# Patient Record
Sex: Female | Born: 1945 | Race: Black or African American | Hispanic: No | Smoking: Former smoker
Health system: Southern US, Community
[De-identification: ages and names within clinical notes are randomized; demographics above are authoritative.]

## PROBLEM LIST (undated history)

## (undated) DIAGNOSIS — H409 Unspecified glaucoma: Secondary | ICD-10-CM

## (undated) DIAGNOSIS — D869 Sarcoidosis, unspecified: Secondary | ICD-10-CM

## (undated) DIAGNOSIS — G2581 Restless legs syndrome: Secondary | ICD-10-CM

## (undated) DIAGNOSIS — H11153 Pinguecula, bilateral: Secondary | ICD-10-CM

## (undated) DIAGNOSIS — Z91018 Allergy to other foods: Secondary | ICD-10-CM

## (undated) DIAGNOSIS — M1990 Unspecified osteoarthritis, unspecified site: Secondary | ICD-10-CM

## (undated) DIAGNOSIS — Z780 Asymptomatic menopausal state: Secondary | ICD-10-CM

## (undated) DIAGNOSIS — M4302 Spondylolysis, cervical region: Secondary | ICD-10-CM

## (undated) DIAGNOSIS — I82409 Acute embolism and thrombosis of unspecified deep veins of unspecified lower extremity: Secondary | ICD-10-CM

## (undated) DIAGNOSIS — J309 Allergic rhinitis, unspecified: Secondary | ICD-10-CM

## (undated) DIAGNOSIS — J449 Chronic obstructive pulmonary disease, unspecified: Secondary | ICD-10-CM

## (undated) DIAGNOSIS — K5900 Constipation, unspecified: Secondary | ICD-10-CM

## (undated) DIAGNOSIS — R42 Dizziness and giddiness: Secondary | ICD-10-CM

## (undated) DIAGNOSIS — I10 Essential (primary) hypertension: Secondary | ICD-10-CM

## (undated) DIAGNOSIS — H35361 Drusen (degenerative) of macula, right eye: Secondary | ICD-10-CM

## (undated) DIAGNOSIS — J439 Emphysema, unspecified: Secondary | ICD-10-CM

## (undated) DIAGNOSIS — J45909 Unspecified asthma, uncomplicated: Secondary | ICD-10-CM

## (undated) DIAGNOSIS — R519 Headache, unspecified: Secondary | ICD-10-CM

## (undated) DIAGNOSIS — M47812 Spondylosis without myelopathy or radiculopathy, cervical region: Secondary | ICD-10-CM

## (undated) DIAGNOSIS — R51 Headache: Secondary | ICD-10-CM

## (undated) DIAGNOSIS — R609 Edema, unspecified: Secondary | ICD-10-CM

## (undated) DIAGNOSIS — H04129 Dry eye syndrome of unspecified lacrimal gland: Secondary | ICD-10-CM

## (undated) DIAGNOSIS — H269 Unspecified cataract: Secondary | ICD-10-CM

## (undated) DIAGNOSIS — K219 Gastro-esophageal reflux disease without esophagitis: Secondary | ICD-10-CM

## (undated) DIAGNOSIS — E119 Type 2 diabetes mellitus without complications: Secondary | ICD-10-CM

## (undated) DIAGNOSIS — M549 Dorsalgia, unspecified: Secondary | ICD-10-CM

## (undated) DIAGNOSIS — F419 Anxiety disorder, unspecified: Secondary | ICD-10-CM

## (undated) DIAGNOSIS — G4700 Insomnia, unspecified: Secondary | ICD-10-CM

## (undated) DIAGNOSIS — E785 Hyperlipidemia, unspecified: Secondary | ICD-10-CM

## (undated) DIAGNOSIS — K635 Polyp of colon: Secondary | ICD-10-CM

## (undated) DIAGNOSIS — K589 Irritable bowel syndrome without diarrhea: Secondary | ICD-10-CM

## (undated) HISTORY — DX: Headache: R51

## (undated) HISTORY — DX: Irritable bowel syndrome, unspecified: K58.9

## (undated) HISTORY — DX: Pinguecula, bilateral: H11.153

## (undated) HISTORY — DX: Spondylosis without myelopathy or radiculopathy, cervical region: M47.812

## (undated) HISTORY — DX: Chronic obstructive pulmonary disease, unspecified: J44.9

## (undated) HISTORY — DX: Unspecified asthma, uncomplicated: J45.909

## (undated) HISTORY — DX: Unspecified glaucoma: H40.9

## (undated) HISTORY — DX: Restless legs syndrome: G25.81

## (undated) HISTORY — DX: Emphysema, unspecified: J43.9

## (undated) HISTORY — DX: Drusen (degenerative) of macula, right eye: H35.361

## (undated) HISTORY — DX: Unspecified cataract: H26.9

## (undated) HISTORY — DX: Insomnia, unspecified: G47.00

## (undated) HISTORY — DX: Polyp of colon: K63.5

## (undated) HISTORY — DX: Spondylolysis, cervical region: M43.02

## (undated) HISTORY — DX: Gastro-esophageal reflux disease without esophagitis: K21.9

## (undated) HISTORY — DX: Hyperlipidemia, unspecified: E78.5

## (undated) HISTORY — PX: IRIDOTOMY / IRIDECTOMY: SHX165

## (undated) HISTORY — DX: Type 2 diabetes mellitus without complications: E11.9

## (undated) HISTORY — DX: Constipation, unspecified: K59.00

## (undated) HISTORY — PX: ABDOMINAL HYSTERECTOMY: SHX81

## (undated) HISTORY — DX: Headache, unspecified: R51.9

## (undated) HISTORY — DX: Allergy to other foods: Z91.018

## (undated) HISTORY — DX: Dizziness and giddiness: R42

## (undated) HISTORY — DX: Essential (primary) hypertension: I10

## (undated) HISTORY — DX: Dorsalgia, unspecified: M54.9

## (undated) HISTORY — PX: MICROLARYNGOSCOPY WITH CO2 LASER AND EXCISION OF VOCAL CORD LESION: SHX5970

## (undated) HISTORY — DX: Acute embolism and thrombosis of unspecified deep veins of unspecified lower extremity: I82.409

## (undated) HISTORY — DX: Edema, unspecified: R60.9

## (undated) HISTORY — DX: Dry eye syndrome of unspecified lacrimal gland: H04.129

## (undated) HISTORY — PX: BREAST BIOPSY: SHX20

## (undated) HISTORY — DX: Asymptomatic menopausal state: Z78.0

## (undated) HISTORY — DX: Sarcoidosis, unspecified: D86.9

## (undated) HISTORY — DX: Allergic rhinitis, unspecified: J30.9

## (undated) HISTORY — DX: Anxiety disorder, unspecified: F41.9

## (undated) HISTORY — DX: Unspecified osteoarthritis, unspecified site: M19.90

---

## 2004-01-19 ENCOUNTER — Emergency Department (HOSPITAL_COMMUNITY): Admission: EM | Admit: 2004-01-19 | Discharge: 2004-01-19 | Payer: Self-pay | Admitting: Emergency Medicine
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
## 2015-06-12 DIAGNOSIS — D862 Sarcoidosis of lung with sarcoidosis of lymph nodes: Secondary | ICD-10-CM | POA: Insufficient documentation

## 2015-12-17 DIAGNOSIS — H401131 Primary open-angle glaucoma, bilateral, mild stage: Secondary | ICD-10-CM | POA: Insufficient documentation

## 2015-12-17 DIAGNOSIS — H2513 Age-related nuclear cataract, bilateral: Secondary | ICD-10-CM | POA: Insufficient documentation

## 2015-12-17 DIAGNOSIS — H16223 Keratoconjunctivitis sicca, not specified as Sjogren's, bilateral: Secondary | ICD-10-CM | POA: Insufficient documentation

## 2016-02-24 DIAGNOSIS — G8929 Other chronic pain: Secondary | ICD-10-CM | POA: Insufficient documentation

## 2016-02-24 DIAGNOSIS — M545 Low back pain: Secondary | ICD-10-CM

## 2016-02-24 DIAGNOSIS — G44229 Chronic tension-type headache, not intractable: Secondary | ICD-10-CM | POA: Insufficient documentation

## 2016-08-03 DIAGNOSIS — K5909 Other constipation: Secondary | ICD-10-CM | POA: Insufficient documentation

## 2016-08-03 DIAGNOSIS — Z780 Asymptomatic menopausal state: Secondary | ICD-10-CM | POA: Insufficient documentation

## 2016-08-03 DIAGNOSIS — M130 Polyarthritis, unspecified: Secondary | ICD-10-CM | POA: Insufficient documentation

## 2017-02-01 DIAGNOSIS — A599 Trichomoniasis, unspecified: Secondary | ICD-10-CM | POA: Insufficient documentation

## 2017-02-19 ENCOUNTER — Encounter: Payer: Self-pay | Admitting: Allergy & Immunology

## 2017-02-19 ENCOUNTER — Ambulatory Visit (INDEPENDENT_AMBULATORY_CARE_PROVIDER_SITE_OTHER): Payer: Medicare Other | Admitting: Allergy & Immunology

## 2017-02-19 VITALS — BP 146/82 | HR 72 | Temp 98.3°F | Resp 20 | Ht 69.0 in | Wt 186.0 lb

## 2017-02-19 DIAGNOSIS — A599 Trichomoniasis, unspecified: Secondary | ICD-10-CM

## 2017-02-19 DIAGNOSIS — J449 Chronic obstructive pulmonary disease, unspecified: Secondary | ICD-10-CM

## 2017-02-19 DIAGNOSIS — Z889 Allergy status to unspecified drugs, medicaments and biological substances status: Secondary | ICD-10-CM

## 2017-02-19 NOTE — Patient Instructions (Addendum)
1. Asthma with COPD - Lung function looked great today. - We will not make any changes at this time since you are not having symptoms.  2. Drug allergy - I am not convinced that the metronidazole allergy is a real allergy. - I would recommend taking daily Zofran with the metronidazole to help improve the gastrointestinal symptoms. - I would also add on cetirizine (Zyrtec) 10mg  daily while taking the metronidazole to help with the hives. - We can give you the first dose here in the clinic setting.  3. Return in about 4 weeks (around 03/19/2017).  Please inform us of any Emergency Department visits, hospitalizations, or changes in symptoms. Call us before going to the ED for breathing or allergy symptoms since we might be able to fit you in for a sick visit. Feel free to contact us anytime with any questions, problems, or concerns.  It was a pleasure to meet you today! Enjoy the holiday season!  Websites that have reliable patient information: 1. American Academy of Asthma, Allergy, and Immunology: www.aaaai.org 2. Food Allergy Research and Education (FARE): foodallergy.org 3. Mothers of Asthmatics: http://www.asthmacommunitynetwork.org 4. American College of Allergy, Asthma, and Immunology: www.acaai.org

## 2017-02-19 NOTE — Progress Notes (Signed)
NEW PATIENT  Date of Service/Encounter:  02/19/17  Referring provider: Ola Spurr., MD   Assessment:   Asthma with COPD  Multiple drug allergies   Trichomonas infection - with label of metronidazole allergy   Plan/Recommendations:   1. Asthma with COPD - Lung function looked great today. - We will not make any changes at this time since you are not having symptoms.  2. Drug allergy - I am not convinced that the metronidazole allergy is a real allergy. - Unfortunately, the only antibiotic with validated testing is penicillin, therefore the only way to determine the validity of other antibiotic allergies are ingestion challenges.  - The gastrointestinal symptoms are a well known side effect of metronidazole, and I believe we can counter that with Zofran. - She does report itching, although she is not entirely clear with the time course of the symptoms.  - We can give her cetirizine to help counter urticaria that might emerge during the challenge.  - To prove that Jocelyn Aguirre does not have an allergy to the medication, we will have her come in for the first dose and monitor her several hours after we give the medication. - I would even consider doing the one dose option for treating the Trichomonas (2gm once), but I will discuss with her OB before doing this.   3. Return in about 4 weeks (around 03/19/2017).  Subjective:   Jocelyn Aguirre is a 71 y.o. female presenting today for evaluation of  Chief Complaint  Patient presents with  . Allergic Reaction    Jocelyn Aguirre has a history of the following: Patient Active Problem List   Diagnosis Date Noted  . Anxiety 02/21/2017  . IBS (irritable bowel syndrome) 02/21/2017  . GERD (gastroesophageal reflux disease) 02/21/2017  . Hyperlipidemia 02/21/2017  . Hypertension 02/21/2017  . Type 2 diabetes mellitus with diabetic polyneuropathy, without long-term current use of insulin (HCC) 02/21/2017  . Trichimoniasis 02/01/2017    . Arthritis of multiple sites 08/03/2016  . Chronic constipation 08/03/2016  . Menopause 08/03/2016  . Chronic bilateral low back pain without sciatica 02/24/2016  . Chronic tension-type headache, not intractable 02/24/2016  . Primary open angle glaucoma of both eyes, mild stage 12/17/2015  . Nuclear sclerotic cataract of both eyes 12/17/2015  . Keratoconjunctivitis sicca of both eyes not specified as Sjogren's 12/17/2015  . 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  Jocelyn Aguirre was referred by Ola Spurr., MD.     Jocelyn Aguirre is a 71 y.o. female presenting for an evaluation of metronidazole allergy. She has a history of multiple other medication allergies as well. Evidently she was diagnosed with bacterial vaginosis and Trichomonas one month ago or so. However, the prescription was sent in but was never filled because of allergy diagnosis. Therefore she presents today for an evaluation of this allergy. She reports pain when she urinates, which has been ongoing for two months now. Interestingly, she has not been sexually active in three years, per the patient. Trichomonas was diagnosed via urinalysis on 01/04/17.      She does report a reaction to metronidazole. She honestly does not know the last time that she had this reaction. She reports that she developed stomach pain with this as well "welps" on her hands. Most of her reactions occur within 24 hours, but not immediately after taking it. With the metronidazole reaction, she took Benadryl and it "ran out of  her system". She thinks that she took two doses before the symptoms started. This reaction was more than 10 years ago, but she is unable to pinpoint it any closer than that.   It should be note that she has hives intermittently even in the absence of her medication triggers. She does have intermittent rhinorrhea, but nothing that really bothers her too much.  She never uses any nasal sprays at all. Jocelyn Aguirre was diagnosed with asthma at "some point". She is followed by Dr. Ysidro Evertoner (last seen by Lysle PearlJustin Blaylock in September 2018, with a rather short clinic note). She was told that she has "10% of this and that" (sarcoidosis/COPD/asthma/etc). Despite these multiple diagnoses, she only takes Xopenes as needed, around twice monthly. She does not cough at all. [name] is fairly normal at this point.  She does not wheeze at all. She carries a diagnosis of sarcoidosis, but it was a "few years back". She was never on prednisone for this at all since she carries a diagnosis of prednisone allergy.   Jocelyn Aguirre does have multiple other drug "allergies", but we did not address those today.  Otherwise, there is no history of other atopic diseases, including food allergies, environmental allergies, stinging insect allergies, or urticaria. There is no significant infectious history. Vaccinations are up to date.    Past Medical History: Patient Active Problem List   Diagnosis Date Noted  . Anxiety 02/21/2017  . IBS (irritable bowel syndrome) 02/21/2017  . GERD (gastroesophageal reflux disease) 02/21/2017  . Hyperlipidemia 02/21/2017  . Hypertension 02/21/2017  . Type 2 diabetes mellitus with diabetic polyneuropathy, without long-term current use of insulin (HCC) 02/21/2017  . Trichimoniasis 02/01/2017  . Arthritis of multiple sites 08/03/2016  . Chronic constipation 08/03/2016  . Menopause 08/03/2016  . Chronic bilateral low back pain without sciatica 02/24/2016  . Chronic tension-type headache, not intractable 02/24/2016  . Primary open angle glaucoma of both eyes, mild stage 12/17/2015  . Nuclear sclerotic cataract of both eyes 12/17/2015  . Keratoconjunctivitis sicca of both eyes not specified as Sjogren's 12/17/2015  . 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) 06/12/2015    Medication List:  Allergies as of 02/19/2017      Reactions    Sitagliptin Hives, Rash   Astemizole Other (See Comments)   unknown   Buspirone Other (See Comments)   Hallucination   Canagliflozin Swelling   Clonidine Other (See Comments)   unknown   Cyclobenzaprine Other (See Comments)   Dicyclomine Other (See Comments)   Unknown   Gentamicin    Ibuprofen Other (See Comments)   unknown   Indomethacin Other (See Comments)   Lactase Other (See Comments)   Unknown   Loratadine Itching   Naproxen Other (See Comments)   unknown   Nsaids Other (See Comments)   unknown   Nystatin Other (See Comments)   unknown   Oxycodone Other (See Comments)   unknown   Oxycodone-acetaminophen Other (See Comments)   unknown   Penicillins Other (See Comments)   unknown   Prednisone Other (See Comments)   unknown   Ramipril Itching   Rofecoxib Other (See Comments)   unknown   Shellfish Allergy Other (See Comments)   unknown   Soy Allergy Other (See Comments)   known   Sulfa Antibiotics    Tizanidine Other (See Comments)   Aspirin Other (See Comments), Rash   Doxycycline Rash   Erythromycin Rash   Fexofenadine Rash, Hives   Propoxyphene Rash, Hives  Medication List        Accurate as of 02/19/17 11:59 PM. Always use your most recent med list.          ACCU-CHEK AVIVA PLUS test strip Generic drug:  glucose blood USE TO TEST ONCE DAILY   amLODipine 5 MG tablet Commonly known as:  NORVASC TAKE 1 TABLET TWICE DAILY.   brimonidine 0.1 % Soln Commonly known as:  ALPHAGAN P Place 1 drop into both eyes 2 times daily.   cetirizine 10 MG tablet Commonly known as:  ZYRTEC Take 10 mg by mouth.   DULoxetine 30 MG capsule Commonly known as:  CYMBALTA Take 30 mg by mouth.   estradiol 0.1 MG/GM vaginal cream Commonly known as:  ESTRACE 0.5 grams twice a week at bedtime for vaginal dryness and irritation   FLOVENT HFA 110 MCG/ACT inhaler Generic drug:  fluticasone Inhale into the lungs.   furosemide 20 MG tablet Commonly known as:   LASIX TAKE 1 TABLET BY MOUTH EVERY DAY   glimepiride 4 MG tablet Commonly known as:  AMARYL Take 8 mg by mouth.   ipratropium 0.06 % nasal spray Commonly known as:  ATROVENT Place into the nose.   levalbuterol 45 MCG/ACT inhaler Commonly known as:  XOPENEX HFA Inhale into the lungs.   losartan 50 MG tablet Commonly known as:  COZAAR TAKE 1 TABLET BY MOUTH TWICE A DAY   lovastatin 10 MG tablet Commonly known as:  MEVACOR TAKE 1 TABLET BY MOUTH DAILY   meclizine 25 MG tablet Commonly known as:  ANTIVERT TAKE 1 TABLET BY MOUTH THREE TIMES A DAY AS NEEDED   metFORMIN 750 MG 24 hr tablet Commonly known as:  GLUCOPHAGE-XR Take 750 mg by mouth.   MULTI-VITAMINS Tabs Take by mouth.   Nebivolol HCl 20 MG Tabs Take 10 mg by mouth.   omeprazole 20 MG capsule Commonly known as:  PRILOSEC Take 20 mg by mouth.   potassium chloride 10 MEQ tablet Commonly known as:  K-DUR,KLOR-CON Take by mouth.   rivaroxaban 20 MG Tabs tablet Commonly known as:  XARELTO Take 20 mg by mouth.   TRAVATAN Z 0.004 % Soln ophthalmic solution Generic drug:  Travoprost (BAK Free) Place 1 drop into both eyes nightly.       Birth History: non-contributory.    Developmental History: non-contributory.   Past Surgical History: Past Surgical History:  Procedure Laterality Date  . ABDOMINAL HYSTERECTOMY    . BREAST BIOPSY Right    benign  . IRIDOTOMY / IRIDECTOMY Bilateral   . MICROLARYNGOSCOPY WITH CO2 LASER AND EXCISION OF VOCAL CORD LESION       Family History: Family History  Problem Relation Age of Onset  . Allergic rhinitis Daughter   . Sinusitis Daughter   . Sinusitis Son   . Angioedema Neg Hx   . Asthma Neg Hx   . Eczema Neg Hx   . Immunodeficiency Neg Hx   . Urticaria Neg Hx      Social History: [place] lives at home by herself. She is retied. She lives in an apartment. She has gas heating and central cooling. There are dust mite coverings on the bedding. There is no  smoking exposure.    Review of Systems: a 14-point review of systems is pertinent for what is mentioned in HPI.  Otherwise, all other systems were negative. Constitutional: negative other than that listed in the HPI Eyes: negative other than that listed in the HPI Ears, nose, mouth, throat, and face:  negative other than that listed in the HPI Respiratory: negative other than that listed in the HPI Cardiovascular: negative other than that listed in the HPI Gastrointestinal: negative other than that listed in the HPI Genitourinary: negative other than that listed in the HPI Integument: negative other than that listed in the HPI Hematologic: negative other than that listed in the HPI Musculoskeletal: negative other than that listed in the HPI Neurological: negative other than that listed in the HPI Allergy/Immunologic: negative other than that listed in the HPI    Objective:   Blood pressure (!) 146/82, pulse 72, temperature 98.3 F (36.8 C), temperature source Oral, resp. rate 20, height 5\' 9"  (1.753 m), weight 186 lb (84.4 kg), SpO2 98 %. Body mass index is 27.47 kg/m.   Physical Exam:  General: Alert, interactive, in no acute distress. Talkative.  Eyes: No conjunctival injection bilaterally, no discharge on the right, no discharge on the left and no Horner-Trantas dots present. PERRL bilaterally. EOMI without pain. No photophobia.  Ears: Right TM pearly gray with normal light reflex, Left TM pearly gray with normal light reflex, Right TM intact without perforation and Left TM intact without perforation.  Nose/Throat: External nose within normal limits and septum midline. Turbinates edematous and pale without discharge. Posterior oropharynx mildly erythematous without cobblestoning in the posterior oropharynx. Tonsils 2+ without exudates.  Tongue without thrush. Neck: Supple without thyromegaly. Trachea midline. Adenopathy: no enlarged lymph nodes appreciated in the anterior  cervical, occipital, axillary, epitrochlear, inguinal, or popliteal regions. Lungs: Clear to auscultation without wheezing, rhonchi or rales. No increased work of breathing. CV: Normal S1/S2. No murmurs. Capillary refill <2 seconds.  Abdomen: Nondistended, nontender. No guarding or rebound tenderness. Bowel sounds present in all fields and hypoactive  Skin: Warm and dry, without lesions or rashes. Extremities:  No clubbing, cyanosis or edema. Neuro:   Grossly intact. No focal deficits appreciated. Responsive to questions.  Diagnostic studies:   Spirometry: results abnormal (FEV1: 1.42/63%, FVC: 2.05/71%, FEV1/FVC: 69%).    Spirometry consistent with possible restrictive disease.   Allergy Studies: none    Malachi BondsJoel Antigone Crowell, MD [facility]

## 2017-02-21 ENCOUNTER — Encounter: Payer: Self-pay | Admitting: Allergy & Immunology

## 2017-02-21 DIAGNOSIS — K589 Irritable bowel syndrome without diarrhea: Secondary | ICD-10-CM | POA: Insufficient documentation

## 2017-02-21 DIAGNOSIS — F419 Anxiety disorder, unspecified: Secondary | ICD-10-CM | POA: Insufficient documentation

## 2017-02-21 DIAGNOSIS — K219 Gastro-esophageal reflux disease without esophagitis: Secondary | ICD-10-CM | POA: Insufficient documentation

## 2017-02-21 DIAGNOSIS — E785 Hyperlipidemia, unspecified: Secondary | ICD-10-CM | POA: Insufficient documentation

## 2017-02-21 DIAGNOSIS — J449 Chronic obstructive pulmonary disease, unspecified: Secondary | ICD-10-CM | POA: Insufficient documentation

## 2017-02-21 DIAGNOSIS — I10 Essential (primary) hypertension: Secondary | ICD-10-CM | POA: Insufficient documentation

## 2017-02-21 DIAGNOSIS — E1142 Type 2 diabetes mellitus with diabetic polyneuropathy: Secondary | ICD-10-CM | POA: Insufficient documentation

## 2017-03-02 ENCOUNTER — Telehealth: Payer: Self-pay | Admitting: *Deleted

## 2017-03-02 MED ORDER — ONDANSETRON 8 MG PO TBDP: 8.0000 mg | ORAL_TABLET | Freq: Three times a day (TID) | ORAL | 0 refills | Status: AC | PRN

## 2017-03-02 MED ORDER — METRONIDAZOLE 500 MG PO TABS: 2000.0000 mg | ORAL_TABLET | Freq: Once | ORAL | 0 refills | Status: AC

## 2017-03-02 NOTE — Telephone Encounter (Signed)
Noted. Thanks for letting me know, Herbert SetaHeather!   Malachi BondsJoel Ezabella Teska, MD [facility] [place]

## 2017-03-02 NOTE — Telephone Encounter (Signed)
Dr. Cliffton AstersWhite office and said it's okay for you to give patient 2 mg of Flagyl

## 2017-03-02 NOTE — Telephone Encounter (Signed)
Script sent in for Flagyl 2000mg  as well as Zofran 8mg  ODT. I will have our nursing staff call Ms. Jocelyn Aguirre to let her know that the medications have been sent. She should pick them up and bring them with her to her drug challenge appointment on 03/12/17.   Malachi BondsJoel Edell Mesenbrink, MD [facility] [place] [facility]

## 2017-03-02 NOTE — Telephone Encounter (Signed)
Informed pt of this she willl bring it in and not take it.

## 2017-03-12 ENCOUNTER — Ambulatory Visit (INDEPENDENT_AMBULATORY_CARE_PROVIDER_SITE_OTHER): Payer: Medicare Other | Admitting: Allergy & Immunology

## 2017-03-12 ENCOUNTER — Encounter: Payer: Self-pay | Admitting: Allergy & Immunology

## 2017-03-12 VITALS — BP 120/76 | HR 72 | Temp 98.2°F | Resp 16

## 2017-03-12 DIAGNOSIS — A599 Trichomoniasis, unspecified: Secondary | ICD-10-CM

## 2017-03-12 DIAGNOSIS — J449 Chronic obstructive pulmonary disease, unspecified: Secondary | ICD-10-CM

## 2017-03-12 DIAGNOSIS — Z889 Allergy status to unspecified drugs, medicaments and biological substances status: Secondary | ICD-10-CM

## 2017-03-12 DIAGNOSIS — J4489 Other specified chronic obstructive pulmonary disease: Secondary | ICD-10-CM

## 2017-03-12 NOTE — Patient Instructions (Addendum)
1. Drug allergy - You tolerated your metronidazole dose today. - This was a large dose, but it should be enough to treat your infection. - You can take an extra Zofran to help with nausea later if needed. - We will remove metronidazole from your list of allergies.  2. Asthma/COPD - Lung testing looked great today. - There is no need for a daily controller medication at this time.  3. Return in about 1 year (around 03/12/2018).   Please inform us of any Emergency Department visits, hospitalizations, or changes in symptoms. Call us before going to the ED for breathing or allergy symptoms since we might be able to fit you in for a sick visit. Feel free to contact us anytime with any questions, problems, or concerns.  It was a pleasure to see you again today! Enjoy the holiday season!  Websites that have reliable patient information: 1. American Academy of Asthma, Allergy, and Immunology: www.aaaai.org 2. Food Allergy Research and Education (FARE): foodallergy.org 3. Mothers of Asthmatics: http://www.asthmacommunitynetwork.org 4. American College of Allergy, Asthma, and Immunology: www.acaai.org

## 2017-03-12 NOTE — Progress Notes (Signed)
FOLLOW UP  Date of Service/Encounter:  03/12/17   Assessment:   Drug allergy  Trichomonas infection  Plan/Recommendations:   1. Drug allergy - You tolerated your metronidazole dose today. - This was a large dose, but it should be enough to treat your infection. - You can take an extra Zofran to help with nausea later if needed. - We will remove metronidazole from your list of allergies.  2. Asthma/COPD - Lung testing looked great today. - There is no need for a daily controller medication at this time.  3. Return in about 1 year (around 03/12/2018).  Subjective:   Jocelyn Aguirre is a 71 y.o. female presenting today for follow up of  Chief Complaint  Patient presents with  . Food/Drug Challenge    Jocelyn Aguirre has a history of the following: Patient Active Problem List   Diagnosis Date Noted  . Anxiety 02/21/2017  . IBS (irritable bowel syndrome) 02/21/2017  . GERD (gastroesophageal reflux disease) 02/21/2017  . Hyperlipidemia 02/21/2017  . Hypertension 02/21/2017  . Type 2 diabetes mellitus with diabetic polyneuropathy, without long-term current use of insulin (HCC) 02/21/2017  . Asthma with COPD (HCC) 02/21/2017  . Trichimoniasis 02/01/2017  . Arthritis of multiple sites 08/03/2016  . Chronic constipation 08/03/2016  . Menopause 08/03/2016  . Chronic bilateral low back pain without sciatica 02/24/2016  . Chronic tension-type headache, not intractable 02/24/2016  . Primary open angle glaucoma of both eyes, mild stage 12/17/2015  . Nuclear sclerotic cataract of both eyes 12/17/2015  . Keratoconjunctivitis sicca of both eyes not specified as Sjogren's 12/17/2015  . 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  Jocelyn Aguirre's Primary Care Provider is Ola SpurrWhite, Ronda S., MD.     Jocelyn Aguirre is a 71 y.o. female presenting for a drug challenge (metronidazole). She was last seen in November 2018  for evaluation of a drug allergy. She was diagnosed with a Trichomonas infection and treatment with metronidazole was recommended. However, she carries a diagnosis of an allergy to metronidazole. Her symptoms were 10 + years ago and included only gastrointestinal distress. As this is a well-known side effect of metronidazole, we decided to go ahead with treatment in the office setting. We also sent in a prescription for   Since the last visit, she has done well. She did bring in her Zofran as well as her metronidazole in today. She has four tablets of 500mg  metronidazole with her today. She is feeling well and reports that she got an "A+" from her home health nurse yesterday. ACT is 24 today, consistent with excellent asthma control. [name] looks perfect. Of note, she is not on beta blockers.  Otherwise, there have been no changes to her past medical history, surgical history, family history, or social history.    Review of Systems: a 14-point review of systems is pertinent for what is mentioned in HPI.  Otherwise, all other systems were negative. Constitutional: negative other than that listed in the HPI Eyes: negative other than that listed in the HPI Ears, nose, mouth, throat, and face: negative other than that listed in the HPI Respiratory: negative other than that listed in the HPI Cardiovascular: negative other than that listed in the HPI Gastrointestinal: negative other than that listed in the HPI Genitourinary: negative other than that listed in the HPI Integument: negative other than that listed in the HPI Hematologic: negative other than that listed in the HPI Musculoskeletal: negative  other than that listed in the HPI Neurological: negative other than that listed in the HPI Allergy/Immunologic: negative other than that listed in the HPI    Objective:   Blood pressure 120/76, pulse 72, temperature 98.2 F (36.8 C), temperature source Oral, resp. rate 16. There is no height or  weight on file to calculate BMI.   Physical Exam: deferred since this is an oral ingestion visit only .    Diagnostic studies:   Spirometry: Normal FEV1, FVC, and FEV1/FVC ratio. There is no scooping suggestive of obstructive disease.   Open graded metronidazole oral challenge: The patient was able to tolerate the challenge today without adverse signs or symptoms. Vital signs were stable throughout the challenge and observation period. She received Zofran 8mg  and was monitored for ten minutes. Then she received a 500mg  tablet of metronidazole. She was monitored for 30 minutes and then the remaining 1500mg  of the metronidazole was administered. She was watched for 60 minutes following the last dose.     Malachi BondsJoel Adden Strout, MD FAAAAI [facility] [place]
